# Patient Record
Sex: Female | Born: 1950 | Marital: Married
Health system: Southern US, Community
[De-identification: ages and names within clinical notes are randomized; demographics above are authoritative.]

---

## 2014-11-08 ENCOUNTER — Encounter: Payer: Self-pay | Admitting: Physical Therapy

## 2014-11-08 ENCOUNTER — Ambulatory Visit: Payer: 59 | Attending: Family Medicine | Admitting: Physical Therapy

## 2014-11-08 DIAGNOSIS — M25611 Stiffness of right shoulder, not elsewhere classified: Secondary | ICD-10-CM | POA: Diagnosis not present

## 2014-11-08 DIAGNOSIS — M25511 Pain in right shoulder: Secondary | ICD-10-CM | POA: Insufficient documentation

## 2014-11-08 NOTE — Therapy (Signed)
[facility] [place] [facility] [address] [place], [place], [address] Phone: [phone]   Fax:  [phone]  Physical Therapy Treatment  Patient Details  Name: Ebony Benton MRN: [id number] Date of Birth: 1951-05-20 Referring Provider:  Macy Mis, MD  Encounter Date: 11/08/2014      PT End of Session - 11/08/14 1331    Visit Number 1   Date for PT Re-Evaluation 01/08/15   PT Start Time 1309   PT Stop Time 1402   PT Time Calculation (min) 53 min   Activity Tolerance Patient tolerated treatment well      History reviewed. No pertinent past medical history.  History reviewed. No pertinent past surgical history.  There were no vitals filed for this visit.  Visit Diagnosis:  Right shoulder pain - Plan: PT plan of care cert/re-cert  Stiffness of right shoulder joint - Plan: PT plan of care cert/re-cert      Subjective Assessment - 11/08/14 1310    Subjective Patient reports that she has been having right upper trap, neck and shoulder pain for about 3 months.  No known cause.  Reports loss of ROM   Pertinent History HTN   Diagnostic tests none   Patient Stated Goals less pain and better ROM   Currently in Pain? Yes   Pain Score 6    Pain Location Shoulder   Pain Orientation Right   Pain Descriptors / Indicators Aching;Tightness   Pain Onset More than a month ago   Pain Frequency Intermittent   Aggravating Factors  dressing and reaching   Pain Relieving Factors rest   Effect of Pain on Daily Activities difficulty dressing and doing hair            [facility] PT Assessment - 11/08/14 0001    Assessment   Medical Diagnosis right shoulder pain   Onset Date/Surgical Date 08/08/14   Hand Dominance Right   Prior Therapy n   Precautions   Precautions None   Balance Screen   Has the patient fallen in the past 6 months No   Has the patient had a decrease in activity level because of a fear of falling?  No   Is the  patient reluctant to leave their home because of a fear of falling?  No   Home Environment   Additional Comments does housework   Prior Function   Level of Independence Independent   Vocation Retired   Leisure walks for exercise   Posture/Postural Control   Posture Comments fwd head, rounded shoulders   ROM / Strength   AROM / PROM / Strength --  right shoulder strength 3+/5 with pain   AROM   Right Shoulder Flexion 100 Degrees   Right Shoulder ABduction 95 Degrees   Right Shoulder Internal Rotation 20 Degrees   Right Shoulder External Rotation 15 Degrees   PROM   Right Shoulder Flexion 105 Degrees   Right Shoulder ABduction 105 Degrees   Right Shoulder Internal Rotation 25 Degrees   Right Shoulder External Rotation 15 Degrees   Palpation   Palpation comment significant tightness and spasm in the right upper trap and neck   Special Tests    Special Tests Rotator Cuff Impingement   Rotator Cuff Impingment tests Neer impingement test;Empty Can test   Neer Impingement test    Findings Positive   Side Right   Empty Can test   Findings Positive   Side Right  [facility] Adult PT Treatment/Exercise - 11/08/14 0001    Modalities   Modalities Electrical Stimulation;Moist Heat   Moist Heat Therapy   Number Minutes Moist Heat 15 Minutes   Moist Heat Location Shoulder   Electrical Stimulation   Electrical Stimulation Location right shoulder upper trap   Electrical Stimulation Action IFC   Electrical Stimulation Parameters tolerance   Electrical Stimulation Goals Pain                PT Education - 11/08/14 1330    Education provided Yes   Education Details HEP to start ROM of the right shoulder   Person(s) Educated Patient   Methods Explanation;Demonstration;Handout   Comprehension Verbalized understanding          PT Short Term Goals - 11/08/14 1333    PT SHORT TERM GOAL #1   Title independent with initial HEP   Time 2   Period  Weeks   Status New           PT Long Term Goals - 11/08/14 1334    PT LONG TERM GOAL #1   Title decrease pain 50%   Time 8   Period Weeks   Status New   PT LONG TERM GOAL #2   Title dress and do hair without difficulty   Time 8   Period Weeks   Status New   PT LONG TERM GOAL #3   Title increase AROM of the right shoulder to 130 degrees flexion   Time 8   Period Weeks   Status New   PT LONG TERM GOAL #4   Title increase AROM of the right shoulder to 60 degrees IR   Time 8   Period Weeks   Status New               Plan - 11/08/14 1332    Pt will benefit from skilled therapeutic intervention in order to improve on the following deficits Decreased range of motion;Decreased strength;Increased muscle spasms;Impaired UE functional use;Pain   Rehab Potential Good   PT Frequency 1x / week   PT Duration 8 weeks   PT Treatment/Interventions ADLs/Self Care Home Management;Electrical Stimulation;Cryotherapy;Moist Heat;Ultrasound;Iontophoresis /ml Dexamethasone;Therapeutic exercise;Patient/family education;Manual techniques   PT Next Visit Plan add gym exercises, PROM   Consulted and Agree with Plan of Care Patient        Problem List There are no active problems to display for this patient.   Jearld Lesch., PT 11/08/2014, 1:37 PM  [facility] [place] [facility] [address] [place], [place], [address] Phone: [phone]   Fax:  [phone]

## 2014-11-15 ENCOUNTER — Encounter: Payer: Self-pay | Admitting: Physical Therapy

## 2014-11-15 ENCOUNTER — Ambulatory Visit: Payer: 59 | Admitting: Physical Therapy

## 2014-11-15 DIAGNOSIS — M25511 Pain in right shoulder: Secondary | ICD-10-CM

## 2014-11-15 DIAGNOSIS — M25611 Stiffness of right shoulder, not elsewhere classified: Secondary | ICD-10-CM

## 2014-11-15 NOTE — Therapy (Signed)
[facility] [place] [facility] [address] [place], [place], [address] Phone: [phone]   Fax:  [phone]  Physical Therapy Treatment  Patient Details  Name: Ebony Benton MRN: [id number] Date of Birth: [birth year] Referring Provider:  Macy Mis, MD  Encounter Date: 11/15/2014      PT End of Session - 11/15/14 1447    Visit Number 2   PT Start Time 1351   PT Stop Time 1457   PT Time Calculation (min) 66 min      History reviewed. No pertinent past medical history.  History reviewed. No pertinent past surgical history.  There were no vitals filed for this visit.  Visit Diagnosis:  Right shoulder pain  Stiffness of right shoulder joint      Subjective Assessment - 11/15/14 1356    Subjective (p) Pt report pain is less with more mobility,    Limitations (p) --  None                         [facility] Adult PT Treatment/Exercise - 11/15/14 0001    Shoulder Exercises: Standing   Theraband Level (Shoulder Internal Rotation) Level 3 (Green)  15 reps 2 sets    Flexion 10 reps  isometric with 3 sec hold, 2 sets    Theraband Level (Shoulder Extension) Level 2 (Red)  2 sets 15 reps    Theraband Level (Shoulder Row) Level 2 (Red)  2 sets 15 reps   Other Standing Exercises Standing shoulder circles with green ball, 20 revolutions both directions 2 sets    Other Standing Exercises Standing bicep curs #3 15 reps 2 sets, Standing shoulder flexion and scaption 10 reps 2 sets each.    Moist Heat Therapy   Number Minutes Moist Heat 15 Minutes   Moist Heat Location Shoulder   Electrical Stimulation   Electrical Stimulation Location right shoulder upper trap   Electrical Stimulation Action IFC   Electrical Stimulation Parameters tolerance   Electrical Stimulation Goals Pain   Manual Therapy   Manual Therapy Joint mobilization;Soft tissue mobilization;Passive ROM  Mobilization with movement    Manual therapy  comments tight joint capsule    Joint Mobilization Superior - inferior,  anterior - posterior  glides grade 2-3                   PT Short Term Goals - 11/15/14 1451    PT SHORT TERM GOAL #1   Title independent with initial HEP   Status On-going           PT Long Term Goals - 11/15/14 1452    PT LONG TERM GOAL #2   Status On-going               Plan - 11/15/14 1449    Clinical Impression Statement Pt tolerated treatment well, Pt did report posterior shoulder pain wit standing arm circles, Pt reports that she feel like she has made a lot of progress today and contemplating increasing therapy frequency.    Pt will benefit from skilled therapeutic intervention in order to improve on the following deficits Decreased range of motion;Decreased strength;Increased muscle spasms;Impaired UE functional use;Pain   Rehab Potential Good   PT Frequency 1x / week   PT Duration 8 weeks   PT Treatment/Interventions ADLs/Self Care Home Management;Electrical Stimulation;Cryotherapy;Moist Heat;Iontophoresis /ml Dexamethasone;Therapeutic exercise;Patient/family education;Manual techniques   PT Next Visit Plan progress with gym exercises, PROM, and manual techniques  Problem List There are no active problems to display for this patient.   Grayce Sessions, PTA 11/15/2014, 2:56 PM  [facility] [place] [facility] [address] [place], [place], [address] Phone: [phone]   Fax:  [phone]

## 2014-11-22 ENCOUNTER — Encounter: Payer: Self-pay | Admitting: Physical Therapy

## 2014-11-22 ENCOUNTER — Ambulatory Visit: Payer: 59 | Admitting: Physical Therapy

## 2014-11-22 DIAGNOSIS — M25611 Stiffness of right shoulder, not elsewhere classified: Secondary | ICD-10-CM

## 2014-11-22 DIAGNOSIS — M25511 Pain in right shoulder: Secondary | ICD-10-CM | POA: Diagnosis not present

## 2014-11-22 NOTE — Therapy (Signed)
[facility] [place] [facility] [address] [place], [place], [address] Phone: [phone]   Fax:  [phone]  Physical Therapy Treatment  Patient Details  Name: Ebony Benton MRN: [id number] Date of Birth: [birth year] Referring Provider:  Macy Mis, MD  Encounter Date: 11/22/2014      PT End of Session - 11/22/14 1551    PT Start Time --   PT Stop Time --   PT Time Calculation (min) --   Activity Tolerance --      History reviewed. No pertinent past medical history.  History reviewed. No pertinent past surgical history.  There were no vitals filed for this visit.  Visit Diagnosis:  Right shoulder pain  Stiffness of right shoulder joint      Subjective Assessment - 11/22/14 1545    Subjective Pt states things have been going ok.  She hasn't had as much shooting pain and discomfort this week.  She is able to reach a little bit more-on shelves with pain but manageable and with less thinking.             [facility] PT Assessment - 11/22/14 0001    AROM   Right Shoulder Flexion 106 Degrees   Right Shoulder ABduction 90 Degrees   Right Shoulder Internal Rotation 25 Degrees   Right Shoulder External Rotation 8 Degrees                     [facility] Adult PT Treatment/Exercise - 11/22/14 0001    Lumbar Exercises: Aerobic   UBE (Upper Arm Bike) 4 min manual-1.0, forward/backward   Shoulder Exercises: Standing   Theraband Level (Shoulder Internal Rotation) Level 3 (Green)   Flexion 10 reps   Theraband Level (Shoulder Extension) Level 2 (Red)   Theraband Level (Shoulder Row) Level 2 (Red)   Other Standing Exercises Standing shoulder circles with green ball, 20 revolutions both directions 2 sets, pain w/last set so did  10 sec    Other Standing Exercises Standing bicep curs #3 15 reps 2 sets, Standing shoulder flexion 1# and scaption 10 reps 2 sets 1# each.    Moist Heat Therapy   Number Minutes Moist Heat 15 Minutes    Moist Heat Location Shoulder   Electrical Stimulation   Electrical Stimulation Location right shoulder upper trap   Electrical Stimulation Action IFC   Electrical Stimulation Parameters tolerance   Electrical Stimulation Goals Pain   Manual Therapy   Manual Therapy Joint mobilization;Soft tissue mobilization;Passive ROM   Manual therapy comments tight joint capsule    Joint Mobilization Superior - inferior,  anterior - posterior  glides grade 2-3, muscle energy IR/ER                   PT Short Term Goals - 11/15/14 1451    PT SHORT TERM GOAL #1   Title independent with initial HEP   Status On-going           PT Long Term Goals - 11/15/14 1452    PT LONG TERM GOAL #2   Status On-going               Plan - 11/22/14 1542    Clinical Impression Statement Pt tolerated tx well, pt did report ant shoulder p! with green ball circles.  Pt states she is still having pain but will make sure to follow through with HEP more frequently.     Pt will benefit from skilled therapeutic intervention  in order to improve on the following deficits Decreased range of motion;Decreased strength;Increased muscle spasms;Impaired UE functional use;Pain   Rehab Potential Good   PT Frequency 1x / week   PT Duration 8 weeks   PT Treatment/Interventions ADLs/Self Care Home Management;Electrical Stimulation;Cryotherapy;Moist Heat;Iontophoresis /ml Dexamethasone;Therapeutic exercise;Patient/family education;Manual techniques   PT Next Visit Plan progress with gym exercises, PROM, and manual techniques    Consulted and Agree with Plan of Care Patient        Problem List There are no active problems to display for this patient.   Jearld Lesch., PT 11/22/2014, 5:20 PM  [facility] [place] [address] [place], [place], [address] Phone: [phone]   Fax:  [phone]

## 2014-12-06 ENCOUNTER — Ambulatory Visit: Payer: 59 | Attending: Family Medicine | Admitting: Physical Therapy

## 2014-12-06 ENCOUNTER — Encounter: Payer: Self-pay | Admitting: Physical Therapy

## 2014-12-06 DIAGNOSIS — M25511 Pain in right shoulder: Secondary | ICD-10-CM | POA: Diagnosis present

## 2014-12-06 DIAGNOSIS — M25611 Stiffness of right shoulder, not elsewhere classified: Secondary | ICD-10-CM | POA: Diagnosis present

## 2014-12-06 NOTE — Therapy (Addendum)
[place], [place], [address] Phone: [phone]   Fax:  [phone]  Physical Therapy Treatment  Patient Details  Name: Ebony Benton MRN: [id number] Date of Birth: 08/12/1950 Referring Provider:  Katherina Mires, MD  Encounter Date: 12/06/2014      PT End of Session - 12/06/14 1428    Visit Number 4   Date for PT Re-Evaluation 01/08/15   PT Start Time 8206   PT Stop Time 1440   PT Time Calculation (min) 55 min   Activity Tolerance Patient tolerated treatment well   Behavior During Therapy Restless      History reviewed. No pertinent past medical history.  History reviewed. No pertinent past surgical history.  There were no vitals filed for this visit.  Visit Diagnosis:  Stiffness of right shoulder joint  Right shoulder pain      Subjective Assessment - 12/06/14 1343    Subjective Pt reports that she is doing on but her shoulder could be better. Pt does voice concern about not being able to attend therapy but once a week    Pertinent History HTN   Diagnostic tests none   Patient Stated Goals less pain and better ROM   Currently in Pain? Yes   Pain Score 4    Pain Location Shoulder   Pain Orientation Right   Pain Descriptors / Indicators Sore   Pain Onset More than a month ago                         [facility] Adult PT Treatment/Exercise - 12/06/14 0001    Shoulder Exercises: Standing   Internal Rotation AROM;Strengthening;Right;15 reps  2 sets   Theraband Level (Shoulder Internal Rotation) Level 3 (Green)   Extension AROM;Strengthening;Right;15 reps  2 sets   Theraband Level (Shoulder Extension) Level 3 (Green)   Row 15 reps;Strengthening;AROM  2 sets    Theraband Level (Shoulder Row) Level 2 (Red);Level 3 (Green)   Shoulder Elevation Limitations Standing shoulder ABC's with ball 2 sets    Modalities   Modalities Electrical Stimulation;Moist Heat   Moist  Heat Therapy   Number Minutes Moist Heat 15 Minutes   Moist Heat Location Shoulder   Electrical Stimulation   Electrical Stimulation Location right shoulder upper trap   Electrical Stimulation Action IFC   Electrical Stimulation Parameters to tolerance    Electrical Stimulation Goals Pain   Manual Therapy   Manual Therapy Joint mobilization;Soft tissue mobilization;Passive ROM   Manual therapy comments tight joint capsule    Joint Mobilization Superior - inferior,  anterior - posterior  glides grade 2-3, muscle energy IR/ER                   PT Short Term Goals - 12/06/14 1430    PT SHORT TERM GOAL #1   Title independent with initial HEP   Status On-going           PT Long Term Goals - 12/06/14 1430    PT LONG TERM GOAL #1   Title decrease pain 50%   Status On-going   PT LONG TERM GOAL #2   Title dress and do hair without difficulty   Status On-going   PT LONG TERM GOAL #3   Title increase AROM of the right shoulder to 130 degrees flexion   Status On-going               Plan -  12/06/14 1429    Clinical Impression Statement Pt tolerated treatment fair, she did report pain with standing shoulder ABC's. Pt continues to report non compliance with HEP. Pt reports that she feels like she need to increase her therapy visits.    Pt will benefit from skilled therapeutic intervention in order to improve on the following deficits Decreased range of motion;Decreased strength;Increased muscle spasms;Impaired UE functional use;Pain   PT Frequency 1x / week   PT Duration 8 weeks   PT Treatment/Interventions ADLs/Self Care Home Management;Electrical Stimulation;Cryotherapy;Moist Heat;Iontophoresis 59m/ml Dexamethasone;Therapeutic exercise;Patient/family education;Manual techniques   PT Next Visit Plan progress with gym exercises, PROM, and manual techniques        PHYSICAL THERAPY DISCHARGE SUMMARY  Visits from Start of Care: 4   Plan: Patient agrees to  discharge.  Patient goals were not met. Patient is being discharged due to financial reasons.  ?????       Problem List There are no active problems to display for this patient.   RScot Jun PTA 12/06/2014, 2:32 PM  [address] [address]Phone: [phone]  Fax:  [phone]

## 2014-12-10 ENCOUNTER — Telehealth: Payer: Self-pay

## 2014-12-10 NOTE — Telephone Encounter (Signed)
Patient cancelled appts due to family situation she needs to take care of

## 2014-12-13 ENCOUNTER — Ambulatory Visit: Payer: 59 | Admitting: Physical Therapy

## 2014-12-16 ENCOUNTER — Ambulatory Visit: Payer: 59 | Admitting: Physical Therapy

## 2015-06-02 ENCOUNTER — Other Ambulatory Visit: Payer: Self-pay

## 2015-06-02 DIAGNOSIS — Z1231 Encounter for screening mammogram for malignant neoplasm of breast: Secondary | ICD-10-CM

## 2015-06-10 ENCOUNTER — Ambulatory Visit
Admission: RE | Admit: 2015-06-10 | Discharge: 2015-06-10 | Disposition: A | Discharge status: Home / Self Care | Payer: BLUE CROSS/BLUE SHIELD | Source: Ambulatory Visit

## 2015-06-10 DIAGNOSIS — Z1231 Encounter for screening mammogram for malignant neoplasm of breast: Secondary | ICD-10-CM

## 2017-06-20 ENCOUNTER — Other Ambulatory Visit: Payer: Self-pay | Admitting: Family Medicine

## 2017-06-21 ENCOUNTER — Other Ambulatory Visit: Payer: Self-pay | Admitting: Family Medicine

## 2017-07-03 ENCOUNTER — Other Ambulatory Visit: Payer: Self-pay | Admitting: Family Medicine

## 2017-07-03 DIAGNOSIS — Z780 Asymptomatic menopausal state: Secondary | ICD-10-CM

## 2017-07-03 DIAGNOSIS — Z1231 Encounter for screening mammogram for malignant neoplasm of breast: Secondary | ICD-10-CM

## 2017-07-26 ENCOUNTER — Encounter: Payer: Self-pay | Admitting: Radiology

## 2017-07-26 ENCOUNTER — Ambulatory Visit
Admission: RE | Admit: 2017-07-26 | Discharge: 2017-07-26 | Disposition: A | Discharge status: Home / Self Care | Payer: Medicare HMO | Source: Ambulatory Visit | Attending: Family Medicine | Admitting: Family Medicine

## 2017-07-26 ENCOUNTER — Ambulatory Visit
Admission: RE | Admit: 2017-07-26 | Discharge: 2017-07-26 | Disposition: A | Discharge status: Home / Self Care | Payer: BLUE CROSS/BLUE SHIELD | Source: Ambulatory Visit | Attending: Family Medicine | Admitting: Family Medicine

## 2017-07-26 DIAGNOSIS — Z1231 Encounter for screening mammogram for malignant neoplasm of breast: Secondary | ICD-10-CM

## 2017-07-26 DIAGNOSIS — Z780 Asymptomatic menopausal state: Secondary | ICD-10-CM

## 2018-10-15 ENCOUNTER — Other Ambulatory Visit: Payer: Self-pay | Admitting: Family Medicine

## 2018-10-15 DIAGNOSIS — Z1231 Encounter for screening mammogram for malignant neoplasm of breast: Secondary | ICD-10-CM

## 2018-10-24 ENCOUNTER — Ambulatory Visit
Admission: RE | Admit: 2018-10-24 | Discharge: 2018-10-24 | Disposition: A | Discharge status: Home / Self Care | Payer: Medicare HMO | Source: Ambulatory Visit | Attending: Family Medicine | Admitting: Family Medicine

## 2018-10-24 ENCOUNTER — Other Ambulatory Visit: Payer: Self-pay

## 2018-10-24 DIAGNOSIS — Z1231 Encounter for screening mammogram for malignant neoplasm of breast: Secondary | ICD-10-CM

## 2020-03-30 ENCOUNTER — Other Ambulatory Visit: Payer: Self-pay | Admitting: Family Medicine

## 2020-03-30 DIAGNOSIS — Z1231 Encounter for screening mammogram for malignant neoplasm of breast: Secondary | ICD-10-CM

## 2020-03-31 ENCOUNTER — Ambulatory Visit
Admission: RE | Admit: 2020-03-31 | Discharge: 2020-03-31 | Disposition: A | Discharge status: Home / Self Care | Payer: Medicare HMO | Source: Ambulatory Visit | Attending: Family Medicine | Admitting: Family Medicine

## 2020-03-31 ENCOUNTER — Other Ambulatory Visit: Payer: Self-pay

## 2020-03-31 DIAGNOSIS — Z1231 Encounter for screening mammogram for malignant neoplasm of breast: Secondary | ICD-10-CM

## 2021-04-04 ENCOUNTER — Other Ambulatory Visit: Payer: Self-pay | Admitting: Family Medicine

## 2021-04-04 DIAGNOSIS — Z1231 Encounter for screening mammogram for malignant neoplasm of breast: Secondary | ICD-10-CM

## 2021-04-08 ENCOUNTER — Ambulatory Visit
Admission: RE | Admit: 2021-04-08 | Discharge: 2021-04-08 | Disposition: A | Discharge status: Home / Self Care | Payer: Medicare HMO | Source: Ambulatory Visit | Attending: Family Medicine | Admitting: Family Medicine

## 2021-04-08 DIAGNOSIS — Z1231 Encounter for screening mammogram for malignant neoplasm of breast: Secondary | ICD-10-CM

## 2022-04-02 ENCOUNTER — Other Ambulatory Visit: Payer: Self-pay | Admitting: Family Medicine

## 2022-04-02 DIAGNOSIS — Z1231 Encounter for screening mammogram for malignant neoplasm of breast: Secondary | ICD-10-CM

## 2022-04-10 ENCOUNTER — Ambulatory Visit
Admission: RE | Admit: 2022-04-10 | Discharge: 2022-04-10 | Disposition: A | Discharge status: Home / Self Care | Payer: Medicare HMO | Source: Ambulatory Visit | Attending: Family Medicine | Admitting: Family Medicine

## 2022-04-10 DIAGNOSIS — Z1231 Encounter for screening mammogram for malignant neoplasm of breast: Secondary | ICD-10-CM

## 2022-08-15 ENCOUNTER — Other Ambulatory Visit: Payer: Self-pay

## 2022-10-24 IMAGING — MG MM DIGITAL SCREENING BILAT W/ TOMO AND CAD
6 of 10 series · 6 of 30 positions shown · non-contrast
Comparison: Previous exam(s).

CLINICAL DATA: Screening.

EXAM:
DIGITAL SCREENING BILATERAL MAMMOGRAM WITH TOMOSYNTHESIS AND CAD
TECHNIQUE: Bilateral screening digital craniocaudal and mediolateral oblique
mammograms were obtained. Bilateral screening digital breast
tomosynthesis was performed. The images were evaluated with
computer-aided detection.

[L CC synth-2D]
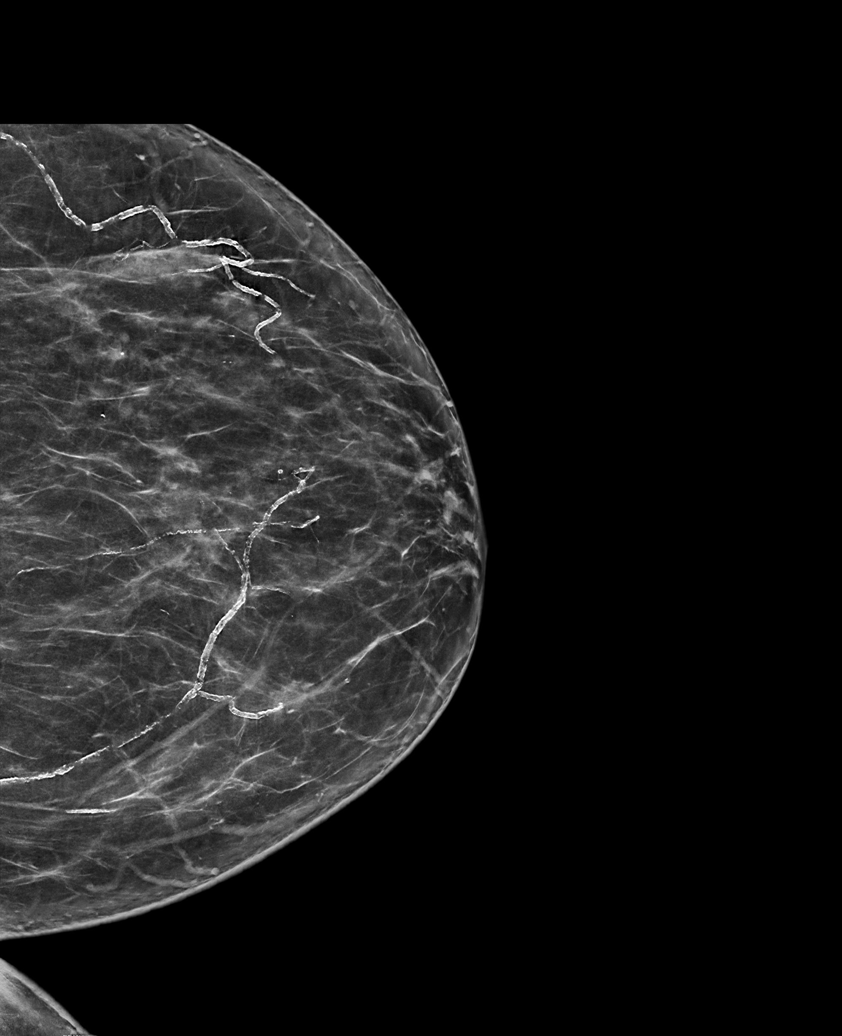

[R CC synth-2D]
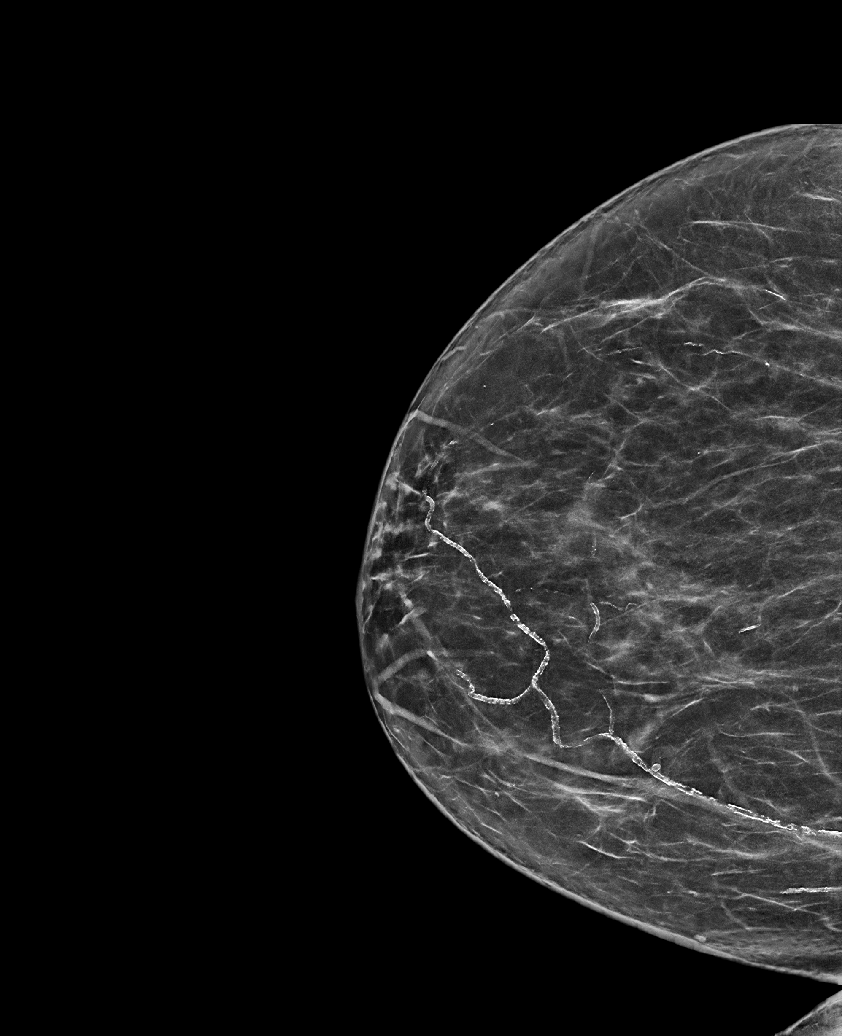

[R MLO synth-2D (1 of 2)]
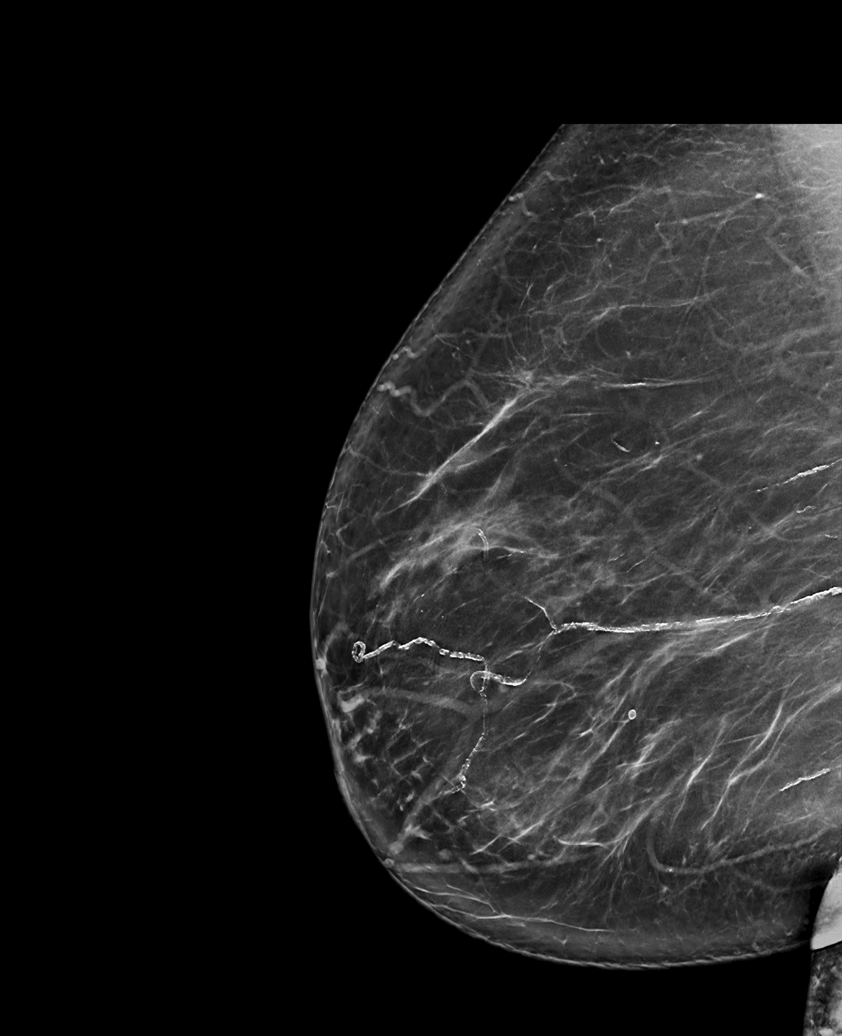

[R MLO synth-2D (2 of 2)]
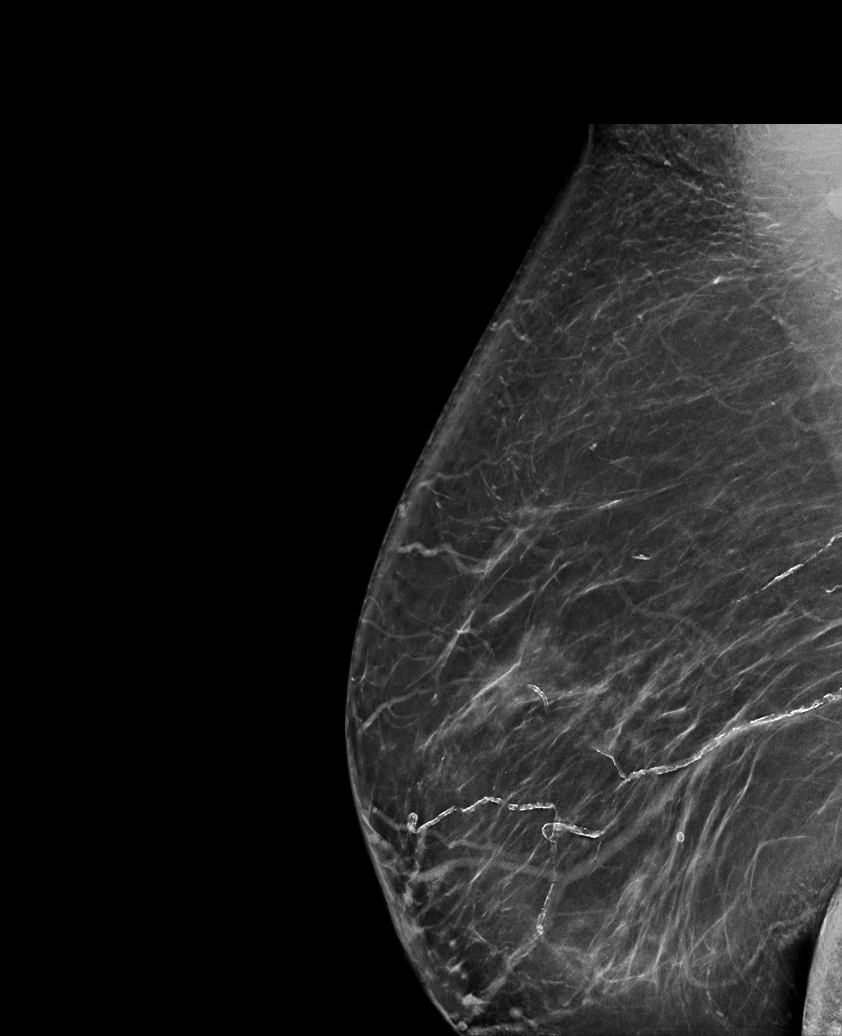

[L MLO synth-2D]
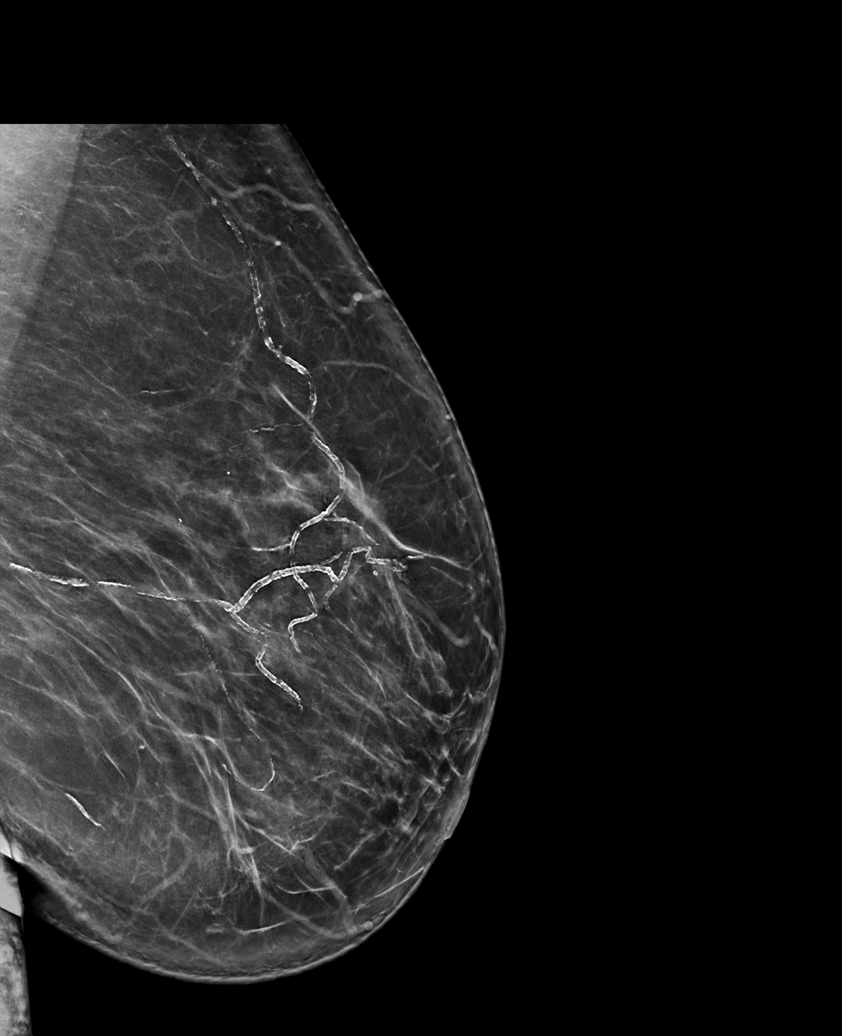

[L MLO tomo · tomo slice 46/91.0]
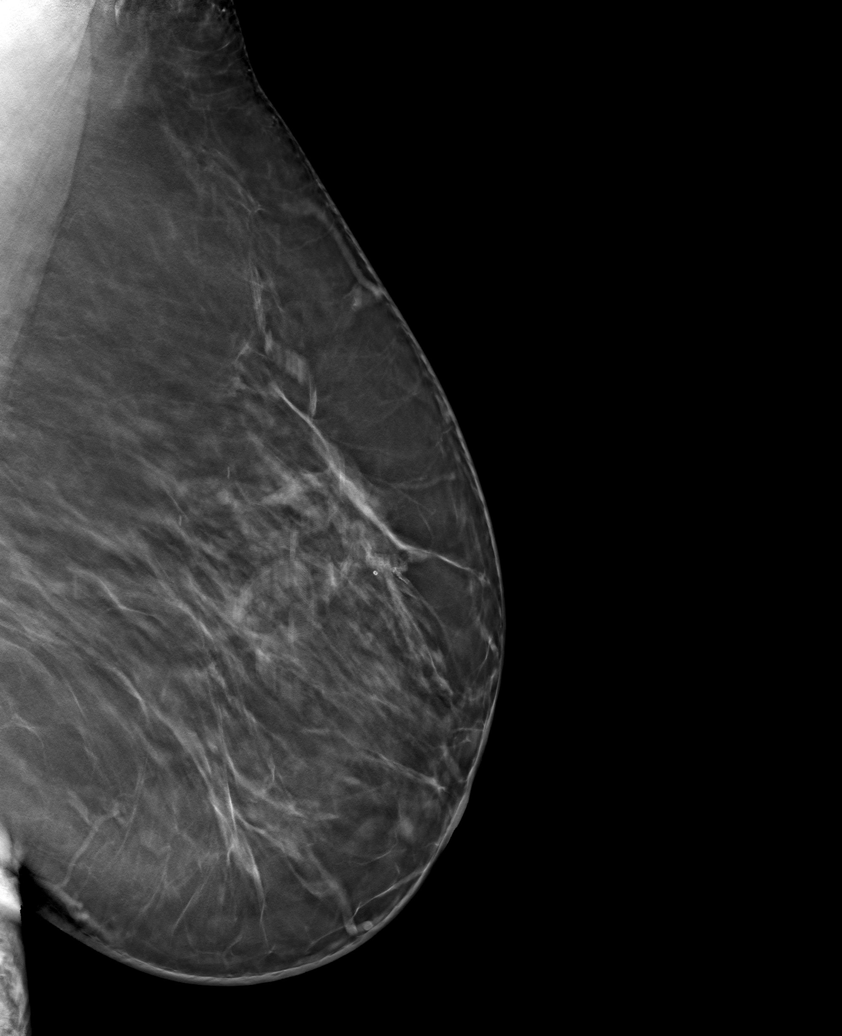

[6 of 30 positions shown; findings below may reference images not displayed]

ACR Breast Density Category b: There are scattered areas of
fibroglandular density.
FINDINGS: There are no findings suspicious for malignancy.
IMPRESSION: No mammographic evidence of malignancy. A result letter of this
screening mammogram will be mailed directly to the patient.

RECOMMENDATION:
Screening mammogram in one year. (Code:51-O-LD2)

BI-RADS CATEGORY  1: Negative.

## 2022-12-04 ENCOUNTER — Other Ambulatory Visit: Payer: Self-pay | Admitting: Family Medicine

## 2022-12-04 DIAGNOSIS — Z780 Asymptomatic menopausal state: Secondary | ICD-10-CM

## 2023-08-01 ENCOUNTER — Inpatient Hospital Stay: Admission: RE | Admit: 2023-08-01 | Payer: Medicare HMO | Source: Ambulatory Visit
